# Patient Record
Sex: Female | Born: 1945 | Race: White | Hispanic: No | Marital: Married | Smoking: Former smoker
Health system: Southern US, Community
[De-identification: ages and names within clinical notes are randomized; demographics above are authoritative.]

## PROBLEM LIST (undated history)

## (undated) DIAGNOSIS — G3184 Mild cognitive impairment, so stated: Secondary | ICD-10-CM

## (undated) DIAGNOSIS — E785 Hyperlipidemia, unspecified: Secondary | ICD-10-CM

## (undated) DIAGNOSIS — C439 Malignant melanoma of skin, unspecified: Secondary | ICD-10-CM

## (undated) DIAGNOSIS — I639 Cerebral infarction, unspecified: Secondary | ICD-10-CM

## (undated) DIAGNOSIS — I69328 Other speech and language deficits following cerebral infarction: Secondary | ICD-10-CM

## (undated) DIAGNOSIS — C50919 Malignant neoplasm of unspecified site of unspecified female breast: Secondary | ICD-10-CM

## (undated) HISTORY — PX: BREAST LUMPECTOMY: SHX2

## (undated) HISTORY — PX: TONSILLECTOMY: SUR1361

## (undated) HISTORY — PX: ABDOMINAL HYSTERECTOMY: SHX81

---

## 1991-05-08 HISTORY — PX: BREAST EXCISIONAL BIOPSY: SUR124

## 2007-05-08 DIAGNOSIS — C50919 Malignant neoplasm of unspecified site of unspecified female breast: Secondary | ICD-10-CM

## 2007-05-08 HISTORY — DX: Malignant neoplasm of unspecified site of unspecified female breast: C50.919

## 2009-02-04 ENCOUNTER — Ambulatory Visit: Payer: Self-pay | Admitting: Internal Medicine

## 2009-02-16 ENCOUNTER — Ambulatory Visit: Payer: Self-pay | Admitting: Internal Medicine

## 2009-03-07 ENCOUNTER — Ambulatory Visit: Payer: Self-pay | Admitting: Internal Medicine

## 2009-08-05 ENCOUNTER — Ambulatory Visit: Payer: Self-pay | Admitting: Internal Medicine

## 2009-08-17 ENCOUNTER — Ambulatory Visit: Payer: Self-pay | Admitting: Internal Medicine

## 2009-09-04 ENCOUNTER — Ambulatory Visit: Payer: Self-pay | Admitting: Internal Medicine

## 2009-10-05 ENCOUNTER — Ambulatory Visit: Payer: Self-pay | Admitting: Internal Medicine

## 2010-02-04 ENCOUNTER — Ambulatory Visit: Payer: Self-pay | Admitting: Internal Medicine

## 2010-02-17 ENCOUNTER — Ambulatory Visit: Payer: Self-pay | Admitting: Internal Medicine

## 2010-02-18 LAB — CANCER ANTIGEN 27.29: CA 27.29: 17.3 U/mL (ref 0.0–38.6)

## 2010-03-07 ENCOUNTER — Ambulatory Visit: Payer: Self-pay | Admitting: Internal Medicine

## 2010-03-13 ENCOUNTER — Ambulatory Visit: Payer: Self-pay | Admitting: Gastroenterology

## 2010-08-18 ENCOUNTER — Ambulatory Visit: Payer: Self-pay | Admitting: Internal Medicine

## 2010-08-20 LAB — CANCER ANTIGEN 27.29: CA 27.29: 18.8 U/mL (ref 0.0–38.6)

## 2010-09-05 ENCOUNTER — Ambulatory Visit: Payer: Self-pay | Admitting: Internal Medicine

## 2010-10-06 ENCOUNTER — Ambulatory Visit: Payer: Self-pay | Admitting: Internal Medicine

## 2011-10-02 ENCOUNTER — Ambulatory Visit: Payer: Self-pay | Admitting: Oncology

## 2011-10-02 LAB — CBC CANCER CENTER
Basophil #: 0.1 x10 3/mm (ref 0.0–0.1)
Eosinophil #: 0.4 x10 3/mm (ref 0.0–0.7)
Eosinophil %: 8.2 %
HGB: 13.9 g/dL (ref 12.0–16.0)
Lymphocyte %: 28.9 %
MCH: 31 pg (ref 26.0–34.0)
MCHC: 32.8 g/dL (ref 32.0–36.0)
Monocyte #: 0.3 x10 3/mm (ref 0.2–0.9)
Monocyte %: 7.2 %
Neutrophil #: 2.5 x10 3/mm (ref 1.4–6.5)
Neutrophil %: 54.3 %
Platelet: 233 x10 3/mm (ref 150–440)
RBC: 4.49 10*6/uL (ref 3.80–5.20)
WBC: 4.6 x10 3/mm (ref 3.6–11.0)

## 2011-10-02 LAB — COMPREHENSIVE METABOLIC PANEL
Albumin: 4.2 g/dL (ref 3.4–5.0)
Anion Gap: 8 (ref 7–16)
BUN: 18 mg/dL (ref 7–18)
Bilirubin,Total: 0.5 mg/dL (ref 0.2–1.0)
Calcium, Total: 9.4 mg/dL (ref 8.5–10.1)
Chloride: 105 mmol/L (ref 98–107)
Creatinine: 1.23 mg/dL (ref 0.60–1.30)
Glucose: 143 mg/dL — ABNORMAL HIGH (ref 65–99)
Potassium: 4.1 mmol/L (ref 3.5–5.1)
SGOT(AST): 33 U/L (ref 15–37)
SGPT (ALT): 34 U/L
Total Protein: 7.8 g/dL (ref 6.4–8.2)

## 2011-10-03 LAB — CANCER ANTIGEN 27.29: CA 27.29: 24.2 U/mL (ref 0.0–38.6)

## 2011-10-06 ENCOUNTER — Ambulatory Visit: Payer: Self-pay | Admitting: Oncology

## 2011-11-05 ENCOUNTER — Ambulatory Visit: Payer: Self-pay | Admitting: Oncology

## 2012-04-07 ENCOUNTER — Ambulatory Visit: Payer: Self-pay | Admitting: Oncology

## 2012-04-07 LAB — COMPREHENSIVE METABOLIC PANEL
Albumin: 4.1 g/dL (ref 3.4–5.0)
Anion Gap: 11 (ref 7–16)
BUN: 16 mg/dL (ref 7–18)
Bilirubin,Total: 0.5 mg/dL (ref 0.2–1.0)
Calcium, Total: 9.7 mg/dL (ref 8.5–10.1)
Co2: 27 mmol/L (ref 21–32)
Glucose: 98 mg/dL (ref 65–99)
Osmolality: 282 (ref 275–301)
Potassium: 4 mmol/L (ref 3.5–5.1)
SGOT(AST): 34 U/L (ref 15–37)
Sodium: 141 mmol/L (ref 136–145)

## 2012-04-07 LAB — CBC CANCER CENTER
Basophil #: 0.1 x10 3/mm (ref 0.0–0.1)
Basophil %: 1.8 %
Eosinophil #: 0.4 x10 3/mm (ref 0.0–0.7)
Eosinophil %: 8.1 %
HCT: 40.3 % (ref 35.0–47.0)
HGB: 13.6 g/dL (ref 12.0–16.0)
Lymphocyte #: 1.4 x10 3/mm (ref 1.0–3.6)
Lymphocyte %: 30.8 %
MCHC: 33.7 g/dL (ref 32.0–36.0)
MCV: 92 fL (ref 80–100)
Monocyte %: 9.3 %
Neutrophil #: 2.3 x10 3/mm (ref 1.4–6.5)
Neutrophil %: 50 %
RBC: 4.38 10*6/uL (ref 3.80–5.20)
RDW: 13.9 % (ref 11.5–14.5)

## 2012-05-07 ENCOUNTER — Ambulatory Visit: Payer: Self-pay | Admitting: Oncology

## 2012-10-06 ENCOUNTER — Ambulatory Visit: Payer: Self-pay | Admitting: Oncology

## 2012-10-06 LAB — COMPREHENSIVE METABOLIC PANEL
Albumin: 4.1 g/dL (ref 3.4–5.0)
Alkaline Phosphatase: 45 U/L — ABNORMAL LOW (ref 50–136)
Anion Gap: 10 (ref 7–16)
BUN: 13 mg/dL (ref 7–18)
Bilirubin,Total: 0.5 mg/dL (ref 0.2–1.0)
Chloride: 102 mmol/L (ref 98–107)
Co2: 27 mmol/L (ref 21–32)
Creatinine: 1.25 mg/dL (ref 0.60–1.30)
EGFR (African American): 52 — ABNORMAL LOW
EGFR (Non-African Amer.): 45 — ABNORMAL LOW
Osmolality: 278 (ref 275–301)
Potassium: 4.2 mmol/L (ref 3.5–5.1)
SGOT(AST): 32 U/L (ref 15–37)

## 2012-10-06 LAB — CBC CANCER CENTER
Basophil %: 1.3 %
HCT: 41.5 % (ref 35.0–47.0)
Lymphocyte #: 1.5 x10 3/mm (ref 1.0–3.6)
MCH: 30.9 pg (ref 26.0–34.0)
MCHC: 33.9 g/dL (ref 32.0–36.0)
MCV: 91 fL (ref 80–100)
Monocyte #: 0.4 x10 3/mm (ref 0.2–0.9)
Neutrophil %: 51.8 %
Platelet: 219 x10 3/mm (ref 150–440)
RBC: 4.56 10*6/uL (ref 3.80–5.20)
RDW: 13.7 % (ref 11.5–14.5)
WBC: 4.5 x10 3/mm (ref 3.6–11.0)

## 2012-10-07 LAB — CANCER ANTIGEN 27.29: CA 27.29: 23 U/mL (ref 0.0–38.6)

## 2012-11-04 ENCOUNTER — Ambulatory Visit: Payer: Self-pay | Admitting: Oncology

## 2012-12-05 ENCOUNTER — Ambulatory Visit: Payer: Self-pay | Admitting: Oncology

## 2013-04-06 ENCOUNTER — Ambulatory Visit: Payer: Self-pay | Admitting: Oncology

## 2013-04-06 LAB — COMPREHENSIVE METABOLIC PANEL
Albumin: 4.2 g/dL (ref 3.4–5.0)
Alkaline Phosphatase: 50 U/L
Anion Gap: 6 — ABNORMAL LOW (ref 7–16)
Bilirubin,Total: 0.4 mg/dL (ref 0.2–1.0)
Co2: 31 mmol/L (ref 21–32)
EGFR (African American): 56 — ABNORMAL LOW
EGFR (Non-African Amer.): 48 — ABNORMAL LOW
Glucose: 100 mg/dL — ABNORMAL HIGH (ref 65–99)
Potassium: 4.5 mmol/L (ref 3.5–5.1)
SGOT(AST): 25 U/L (ref 15–37)
SGPT (ALT): 34 U/L (ref 12–78)

## 2013-04-06 LAB — CBC CANCER CENTER
Eosinophil %: 6.4 %
HCT: 42.9 % (ref 35.0–47.0)
HGB: 14.3 g/dL (ref 12.0–16.0)
MCH: 30.6 pg (ref 26.0–34.0)
MCV: 92 fL (ref 80–100)
Monocyte #: 0.5 x10 3/mm (ref 0.2–0.9)
Monocyte %: 9.2 %
Platelet: 238 x10 3/mm (ref 150–440)
RBC: 4.65 10*6/uL (ref 3.80–5.20)
WBC: 5.1 x10 3/mm (ref 3.6–11.0)

## 2013-04-07 LAB — CANCER ANTIGEN 27.29: CA 27.29: 16.4 U/mL (ref 0.0–38.6)

## 2013-05-07 ENCOUNTER — Ambulatory Visit: Payer: Self-pay | Admitting: Oncology

## 2013-06-07 ENCOUNTER — Ambulatory Visit: Payer: Self-pay | Admitting: Oncology

## 2013-09-21 ENCOUNTER — Ambulatory Visit: Payer: Self-pay | Admitting: Neurology

## 2013-11-09 ENCOUNTER — Ambulatory Visit: Payer: Self-pay | Admitting: Oncology

## 2013-11-09 LAB — CBC CANCER CENTER
Basophil #: 0.1 x10 3/mm (ref 0.0–0.1)
Basophil %: 2.1 %
EOS PCT: 6.1 %
Eosinophil #: 0.3 x10 3/mm (ref 0.0–0.7)
HCT: 41.7 % (ref 35.0–47.0)
HGB: 13.8 g/dL (ref 12.0–16.0)
LYMPHS ABS: 1.5 x10 3/mm (ref 1.0–3.6)
LYMPHS PCT: 34.9 %
MCH: 30.5 pg (ref 26.0–34.0)
MCHC: 33 g/dL (ref 32.0–36.0)
MCV: 92 fL (ref 80–100)
MONO ABS: 0.4 x10 3/mm (ref 0.2–0.9)
MONOS PCT: 8.3 %
NEUTROS ABS: 2.1 x10 3/mm (ref 1.4–6.5)
NEUTROS PCT: 48.6 %
PLATELETS: 222 x10 3/mm (ref 150–440)
RBC: 4.53 10*6/uL (ref 3.80–5.20)
RDW: 13.3 % (ref 11.5–14.5)
WBC: 4.3 x10 3/mm (ref 3.6–11.0)

## 2013-11-09 LAB — COMPREHENSIVE METABOLIC PANEL
ALK PHOS: 47 U/L
AST: 21 U/L (ref 15–37)
Albumin: 4 g/dL (ref 3.4–5.0)
Anion Gap: 5 — ABNORMAL LOW (ref 7–16)
BUN: 14 mg/dL (ref 7–18)
Bilirubin,Total: 0.4 mg/dL (ref 0.2–1.0)
CALCIUM: 10 mg/dL (ref 8.5–10.1)
CHLORIDE: 105 mmol/L (ref 98–107)
Co2: 31 mmol/L (ref 21–32)
Creatinine: 1.17 mg/dL (ref 0.60–1.30)
EGFR (African American): 56 — ABNORMAL LOW
EGFR (Non-African Amer.): 48 — ABNORMAL LOW
Glucose: 95 mg/dL (ref 65–99)
Osmolality: 282 (ref 275–301)
Potassium: 4.7 mmol/L (ref 3.5–5.1)
SGPT (ALT): 27 U/L (ref 12–78)
Sodium: 141 mmol/L (ref 136–145)
TOTAL PROTEIN: 7.8 g/dL (ref 6.4–8.2)

## 2013-12-05 ENCOUNTER — Ambulatory Visit: Payer: Self-pay | Admitting: Oncology

## 2014-01-05 ENCOUNTER — Ambulatory Visit: Payer: Self-pay | Admitting: Oncology

## 2014-05-19 ENCOUNTER — Ambulatory Visit: Payer: Self-pay | Admitting: Oncology

## 2014-05-19 LAB — CBC CANCER CENTER
BASOS PCT: 2 %
Basophil #: 0.1 x10 3/mm (ref 0.0–0.1)
EOS ABS: 0.3 x10 3/mm (ref 0.0–0.7)
Eosinophil %: 6.1 %
HCT: 39 % (ref 35.0–47.0)
HGB: 12.8 g/dL (ref 12.0–16.0)
LYMPHS ABS: 1.4 x10 3/mm (ref 1.0–3.6)
Lymphocyte %: 33.1 %
MCH: 29.1 pg (ref 26.0–34.0)
MCHC: 32.8 g/dL (ref 32.0–36.0)
MCV: 89 fL (ref 80–100)
MONO ABS: 0.4 x10 3/mm (ref 0.2–0.9)
Monocyte %: 8.4 %
NEUTROS PCT: 50.4 %
Neutrophil #: 2.1 x10 3/mm (ref 1.4–6.5)
Platelet: 259 x10 3/mm (ref 150–440)
RBC: 4.41 10*6/uL (ref 3.80–5.20)
RDW: 13.7 % (ref 11.5–14.5)
WBC: 4.2 x10 3/mm (ref 3.6–11.0)

## 2014-05-19 LAB — COMPREHENSIVE METABOLIC PANEL
ANION GAP: 4 — AB (ref 7–16)
Albumin: 3.7 g/dL (ref 3.4–5.0)
Alkaline Phosphatase: 47 U/L
BUN: 18 mg/dL (ref 7–18)
Bilirubin,Total: 0.3 mg/dL (ref 0.2–1.0)
Calcium, Total: 9.2 mg/dL (ref 8.5–10.1)
Chloride: 106 mmol/L (ref 98–107)
Co2: 31 mmol/L (ref 21–32)
Creatinine: 1.21 mg/dL (ref 0.60–1.30)
EGFR (African American): 57 — ABNORMAL LOW
EGFR (Non-African Amer.): 47 — ABNORMAL LOW
GLUCOSE: 96 mg/dL (ref 65–99)
Osmolality: 283 (ref 275–301)
POTASSIUM: 4.2 mmol/L (ref 3.5–5.1)
SGOT(AST): 19 U/L (ref 15–37)
SGPT (ALT): 26 U/L
Sodium: 141 mmol/L (ref 136–145)
TOTAL PROTEIN: 7.3 g/dL (ref 6.4–8.2)

## 2014-06-07 ENCOUNTER — Ambulatory Visit: Payer: Self-pay | Admitting: Oncology

## 2014-08-20 ENCOUNTER — Other Ambulatory Visit: Payer: Self-pay | Admitting: Oncology

## 2014-08-20 DIAGNOSIS — Z853 Personal history of malignant neoplasm of breast: Secondary | ICD-10-CM

## 2014-11-26 ENCOUNTER — Other Ambulatory Visit: Payer: Self-pay

## 2014-11-26 ENCOUNTER — Emergency Department: Payer: Commercial Managed Care - HMO

## 2014-11-26 ENCOUNTER — Emergency Department
Admission: EM | Admit: 2014-11-26 | Discharge: 2014-11-26 | Disposition: A | Discharge status: Home / Self Care | Payer: Commercial Managed Care - HMO | Attending: Emergency Medicine | Admitting: Emergency Medicine

## 2014-11-26 DIAGNOSIS — R200 Anesthesia of skin: Secondary | ICD-10-CM | POA: Diagnosis not present

## 2014-11-26 DIAGNOSIS — Z87891 Personal history of nicotine dependence: Secondary | ICD-10-CM | POA: Diagnosis not present

## 2014-11-26 DIAGNOSIS — R0789 Other chest pain: Secondary | ICD-10-CM | POA: Diagnosis present

## 2014-11-26 DIAGNOSIS — R202 Paresthesia of skin: Secondary | ICD-10-CM | POA: Diagnosis not present

## 2014-11-26 DIAGNOSIS — R42 Dizziness and giddiness: Secondary | ICD-10-CM | POA: Diagnosis not present

## 2014-11-26 HISTORY — DX: Hyperlipidemia, unspecified: E78.5

## 2014-11-26 HISTORY — DX: Cerebral infarction, unspecified: I63.9

## 2014-11-26 HISTORY — DX: Mild cognitive impairment of uncertain or unknown etiology: G31.84

## 2014-11-26 HISTORY — DX: Other speech and language deficits following cerebral infarction: I69.328

## 2014-11-26 LAB — TROPONIN I
Troponin I: <0.03 ng/mL (ref <0.031)
Troponin I: <0.03 ng/mL (ref <0.031)

## 2014-11-26 LAB — BASIC METABOLIC PANEL
Anion gap: 9 (ref 5–15)
BUN: 23 mg/dL — ABNORMAL HIGH (ref 6–20)
CALCIUM: 9.9 mg/dL (ref 8.9–10.3)
CO2: 28 mmol/L (ref 22–32)
Chloride: 103 mmol/L (ref 101–111)
Creatinine, Ser: 1.21 mg/dL — ABNORMAL HIGH (ref 0.44–1.00)
GFR calc non Af Amer: 45 mL/min — ABNORMAL LOW (ref >60)
GFR, EST AFRICAN AMERICAN: 52 mL/min — AB (ref >60)
GLUCOSE: 112 mg/dL — AB (ref 65–99)
Potassium: 3.8 mmol/L (ref 3.5–5.1)
Sodium: 140 mmol/L (ref 135–145)

## 2014-11-26 LAB — CBC
HCT: 39.2 % (ref 35.0–47.0)
Hemoglobin: 13 g/dL (ref 12.0–16.0)
MCH: 29.6 pg (ref 26.0–34.0)
MCHC: 33.1 g/dL (ref 32.0–36.0)
MCV: 89.4 fL (ref 80.0–100.0)
Platelets: 208 10*3/uL (ref 150–440)
RBC: 4.38 MIL/uL (ref 3.80–5.20)
RDW: 14.2 % (ref 11.5–14.5)
WBC: 7 10*3/uL (ref 3.6–11.0)

## 2014-11-26 NOTE — ED Notes (Signed)
Pt c/o tightness in chest with dizziness that started around 1045am today.

## 2014-11-26 NOTE — ED Notes (Signed)
Patient transported to CT 

## 2014-11-26 NOTE — ED Provider Notes (Signed)
[facility] Emergency Department Provider Note   ____________________________________________  Time seen: 1420  I have reviewed the triage vital signs and the nursing notes.   HISTORY  Chief Complaint Chest Pain and Dizziness   History limited by: Not Limited   HPI Teresa Turner is a 69 y.o. female who presents to the emergency department today accompanied by her husband because of concerns for an episode of paresthesias as well as chest pressure. The patient was at home earlier this morning when she developed numbness and tingling in her extremities. She states it wasn't her arms and legs the distal aspects. She states that the same time she started developing some chest pressure that was located centrally. The symptoms lasted for a couple of hours. The symptoms of chest pressure and paresthesias have now resolved. She denies any recent fevers.   Past Medical History  Diagnosis Date  . Hyperlipemia   . CVA (cerebral infarction)   . 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 Cancer     breast  . MCI (mild cognitive impairment)     There are no active problems to display for this patient.   Past Surgical History  Procedure Laterality Date  . Tonsillectomy    . Breast lumpectomy    . Abdominal hysterectomy      No current outpatient prescriptions on file.  Allergies Mango flavor  No family history on file.  Social History History  Substance Use Topics  . Smoking status: Former Research scientist (life sciences)  . Smokeless tobacco: Not on file  . Alcohol Use: No    Review of Systems  Constitutional: Negative for fever. Cardiovascular: Also for chest pressure Respiratory: Negative for shortness of breath. Gastrointestinal: Negative for abdominal pain, vomiting and diarrhea. Genitourinary: Negative for dysuria. Musculoskeletal: Negative for back pain. Skin: Negative for rash. Neurological: Numbness and tingling of the extremities  10-point  ROS otherwise negative.  ____________________________________________   PHYSICAL EXAM:  VITAL SIGNS: ED Triage Vitals  Enc Vitals Group     BP 11/26/14 1237 134/83 mmHg     Pulse Rate 11/26/14 1237 67     Resp 11/26/14 1237 17     Temp 11/26/14 1237 97.9 F (36.6 C)     Temp Source 11/26/14 1237 Oral     SpO2 11/26/14 1237 96 %     Weight 11/26/14 1232 165 lb (74.844 kg)     Height 11/26/14 1232 5\' 9"  (1.753 m)     Head Cir --      Peak Flow --      Pain Score 11/26/14 1232 5   Constitutional: Alert and oriented. Well appearing and in no distress. Eyes: Conjunctivae are normal. PERRL. Normal extraocular movements. ENT   Head: Normocephalic and atraumatic.   Nose: No congestion/rhinnorhea.   Mouth/Throat: Mucous membranes are moist.   Neck: No stridor. Hematological/Lymphatic/Immunilogical: No cervical lymphadenopathy. Cardiovascular: Normal rate, regular rhythm.  No murmurs, rubs, or gallops. Respiratory: Normal respiratory effort without tachypnea nor retractions. Breath sounds are clear and equal bilaterally. No wheezes/rales/rhonchi. Gastrointestinal: Soft and nontender. No distention. There is no CVA tenderness. Genitourinary: Deferred Musculoskeletal: Normal range of motion in all extremities. No joint effusions.  No lower extremity tenderness nor edema. Neurologic:  Some word finding difficulty, which is chronic for the patient. No gross focal neurologic deficits are appreciated. Speech is normal.  Skin:  Skin is warm, dry and intact. No rash noted. Psychiatric: Mood and affect are normal. Speech and behavior are  normal. Patient exhibits appropriate insight and judgment.  ____________________________________________    LABS (pertinent positives/negatives)  Labs Reviewed  BASIC METABOLIC PANEL - Abnormal; Notable for the following:    Glucose, Bld 112 (*)    BUN 23 (*)    Creatinine, Ser 1.21 (*)    GFR calc non Af Amer 45 (*)    GFR calc Af Amer 52  (*)    All other components within normal limits  CBC  TROPONIN I  TROPONIN I   2nd trop pending at time of signout  ____________________________________________   EKG  I, Nance Pear, attending physician, personally viewed and interpreted this EKG  EKG Time: 1233 Rate: 68 Rhythm: NSR Axis: normal Intervals: qtc 472 QRS: narrow ST changes: No st elevation    ____________________________________________    RADIOLOGY  CXR:  IMPRESSION: No acute disease.  CT head: IMPRESSION: No acute intracranial abnormality.  ____________________________________________   PROCEDURES  Procedure(s) performed: None  Critical Care performed: No  ____________________________________________   INITIAL IMPRESSION / ASSESSMENT AND PLAN / ED COURSE  Pertinent labs & imaging results that were available during my care of the patient were reviewed by me and considered in my medical decision making (see chart for details).  Patient presents to the emergency department today after episode of numbness and tingling as well as chest pressure. The symptoms have now resolved. No concerning findings on physical exam. Head CT and chest x-ray without concerning findings, EKG without concerning findings. Given episode of chest pressure will check second Trop. If negative feel patient is safe for discharge home.  ____________________________________________   FINAL CLINICAL IMPRESSION(S) / ED DIAGNOSES  Chest pressure Parasthesias  Nance Pear, MD 11/26/14 [phone]

## 2014-11-26 NOTE — ED Provider Notes (Signed)
-----------------------------------------   3:30 PM on 11/26/2014 -----------------------------------------   Blood pressure 134/85, pulse 73, temperature 97.9 F (36.6 C), temperature source Oral, resp. rate 23, height 5\' 9"  (1.753 m), weight 165 lb (74.844 kg), SpO2 99 %.  Assuming care from Dr. Archie Balboa.  In short, Teresa Turner is a 69 y.o. female with a chief complaint of Chest Pain and Dizziness .  Refer to the original H&P for additional details.  The current plan of care is to follow-up second troponin and reassess.  ----------------------------------------- 4:53 PM on 11/26/2014 -----------------------------------------  The patient is very well-appearing, laughing and joking with me, and has had no repeat symptoms since she has been in the emergency department.  Her second troponin was negative.  I had an extensive discussion with the patient and her husband about her reassuring workup but the need for close outpatient follow-up.  Rather than following up directly with a new cardiologist, the patient prefers to follow-up with Dr. Ouida Sills, her primary care doctor, and I feel that that is appropriate.  I discussed with them the small but theoretical possibility that this could be either a TIA, or a "cardiac event ", but they state strongly feel that outpatient follow-up is appropriate and I agree.  I gave my usual and customary return precautions.   Teresa Kehr, MD 11/26/14 1655

## 2014-11-26 NOTE — ED Notes (Signed)
Pt discharged home after verbalizing understanding of discharge instructions; nad noted. 

## 2014-11-26 NOTE — Discharge Instructions (Signed)
You have been seen in the Emergency Department (ED) today for chest pressure and numbness/tingling.  As we have discussed todays test results are normal, but you may require further testing.  Please follow up with the recommended doctor as instructed above in these documents regarding todays emergent visit and your recent symptoms to discuss further management.  Continue to take your regular medications. If you are not doing so already, please also take a daily baby aspirin (81 mg), at least until you follow up with your doctor.  Return to the Emergency Department (ED) if you experience any further chest pain/pressure/tightness, difficulty breathing, or sudden sweating, or other symptoms that concern you.   Chest Pain (Nonspecific) It is often hard to give a specific diagnosis for the cause of chest pain. There is always a chance that your pain could be related to something serious, such as a heart attack or a blood clot in the lungs. You need to follow up with your health care provider for further evaluation. CAUSES   Heartburn.  Pneumonia or bronchitis.  Anxiety or stress.  Inflammation around your heart (pericarditis) or lung (pleuritis or pleurisy).  A blood clot in the lung.  A collapsed lung (pneumothorax). It can develop suddenly on its own (spontaneous pneumothorax) or from trauma to the chest.  Shingles infection (herpes zoster virus). The chest wall is composed of bones, muscles, and cartilage. Any of these can be the source of the pain.  The bones can be bruised by injury.  The muscles or cartilage can be strained by coughing or overwork.  The cartilage can be affected by inflammation and become sore (costochondritis). DIAGNOSIS  Lab tests or other studies may be needed to find the cause of your pain. Your health care provider may have you take a test called an ambulatory electrocardiogram (ECG). An ECG records your heartbeat patterns over a 24-hour period. You may also  have other tests, such as:  Transthoracic echocardiogram (TTE). During echocardiography, sound waves are used to evaluate how blood flows through your heart.  Transesophageal echocardiogram (TEE).  Cardiac monitoring. This allows your health care provider to monitor your heart rate and rhythm in real time.  Holter monitor. This is a portable device that records your heartbeat and can help diagnose heart arrhythmias. It allows your health care provider to track your heart activity for several days, if needed.  Stress tests by exercise or by giving medicine that makes the heart beat faster. TREATMENT   Treatment depends on what may be causing your chest pain. Treatment may include:  Acid blockers for heartburn.  Anti-inflammatory medicine.  Pain medicine for inflammatory conditions.  Antibiotics if an infection is present.  You may be advised to change lifestyle habits. This includes stopping smoking and avoiding alcohol, caffeine, and chocolate.  You may be advised to keep your head raised (elevated) when sleeping. This reduces the chance of acid going backward from your stomach into your esophagus. Most of the time, nonspecific chest pain will improve within 2-3 days with rest and mild pain medicine.  HOME CARE INSTRUCTIONS   If antibiotics were prescribed, take them as directed. Finish them even if you start to feel better.  For the next few days, avoid physical activities that bring on chest pain. Continue physical activities as directed.  Do not use any tobacco products, including cigarettes, chewing tobacco, or electronic cigarettes.  Avoid drinking alcohol.  Only take medicine as directed by your health care provider.  Follow your health care provider's  suggestions for further testing if your chest pain does not go away.  Keep any follow-up appointments you made. If you do not go to an appointment, you could develop lasting (chronic) problems with pain. If there is any  problem keeping an appointment, call to reschedule. SEEK MEDICAL CARE IF:   Your chest pain does not go away, even after treatment.  You have a rash with blisters on your chest.  You have a fever. SEEK IMMEDIATE MEDICAL CARE IF:   You have increased chest pain or pain that spreads to your arm, neck, jaw, back, or abdomen.  You have shortness of breath.  You have an increasing cough, or you cough up blood.  You have severe back or abdominal pain.  You feel nauseous or vomit.  You have severe weakness.  You faint.  You have chills. This is an emergency. Do not wait to see if the pain will go away. Get medical help at once. Call your local emergency services (911 in [place] Do not drive yourself to the hospital. MAKE SURE YOU:   Understand these instructions.  Will watch your condition.  Will get help right away if you are not doing well or get worse. Document Released: 01/31/2005 Document Revised: 04/28/2013 Document Reviewed: 11/27/2007 [facility] Patient Information 2015 [place], [place]. This information is not intended to replace advice given to you by your health care provider. Make sure you discuss any questions you have with your health care provider.

## 2014-11-26 NOTE — ED Notes (Signed)
Pt from home with husband. Pt has difficulty speaking (word finding) due to MCI (mild cognitive impairment) so husband is used to helping her speak. He stated that he returned home around 1130 and found the pt distressed because she had become dizzy, lightheaded, and had chest pain while he was gone. She states that she had tingling in her extremities and that she had tightness in her central chest, as well as some SOB and sweating. Pt denies chest pain at this time. She reports that the tingling in her extremities comes and goes. Husband also states that she was taking ?donepril?, stopped, and started it again yesterday. She took it this morning not long before this episode. Pt is alert & oriented (at baseline) during assessment and somewhat tearful.

## 2014-12-09 ENCOUNTER — Ambulatory Visit
Admission: RE | Admit: 2014-12-09 | Discharge: 2014-12-09 | Disposition: A | Discharge status: Home / Self Care | Payer: Commercial Managed Care - HMO | Source: Ambulatory Visit | Attending: Oncology | Admitting: Oncology

## 2014-12-09 ENCOUNTER — Ambulatory Visit: Payer: Self-pay

## 2014-12-09 DIAGNOSIS — Z853 Personal history of malignant neoplasm of breast: Secondary | ICD-10-CM | POA: Diagnosis present

## 2014-12-09 DIAGNOSIS — Z803 Family history of malignant neoplasm of breast: Secondary | ICD-10-CM | POA: Insufficient documentation

## 2014-12-09 HISTORY — DX: Malignant melanoma of skin, unspecified: C43.9

## 2014-12-09 HISTORY — DX: Malignant neoplasm of unspecified site of unspecified female breast: C50.919

## 2015-01-27 ENCOUNTER — Emergency Department
Admission: EM | Admit: 2015-01-27 | Discharge: 2015-01-27 | Disposition: A | Discharge status: Home / Self Care | Payer: Commercial Managed Care - HMO | Attending: Emergency Medicine | Admitting: Emergency Medicine

## 2015-01-27 DIAGNOSIS — R42 Dizziness and giddiness: Secondary | ICD-10-CM | POA: Insufficient documentation

## 2015-01-27 DIAGNOSIS — Z7982 Long term (current) use of aspirin: Secondary | ICD-10-CM | POA: Insufficient documentation

## 2015-01-27 DIAGNOSIS — Z79899 Other long term (current) drug therapy: Secondary | ICD-10-CM | POA: Diagnosis not present

## 2015-01-27 DIAGNOSIS — R55 Syncope and collapse: Secondary | ICD-10-CM

## 2015-01-27 DIAGNOSIS — Z87891 Personal history of nicotine dependence: Secondary | ICD-10-CM | POA: Insufficient documentation

## 2015-01-27 DIAGNOSIS — R61 Generalized hyperhidrosis: Secondary | ICD-10-CM | POA: Insufficient documentation

## 2015-01-27 LAB — URINALYSIS COMPLETE WITH MICROSCOPIC (ARMC ONLY)
Bacteria, UA: NONE SEEN
Bilirubin Urine: NEGATIVE
GLUCOSE, UA: NEGATIVE mg/dL
Hgb urine dipstick: NEGATIVE
Ketones, ur: NEGATIVE mg/dL
Leukocytes, UA: NEGATIVE
Nitrite: NEGATIVE
PH: 7 (ref 5.0–8.0)
PROTEIN: NEGATIVE mg/dL
Specific Gravity, Urine: 1.015 (ref 1.005–1.030)

## 2015-01-27 LAB — COMPREHENSIVE METABOLIC PANEL
ALT: 24 U/L (ref 14–54)
AST: 30 U/L (ref 15–41)
Albumin: 4.4 g/dL (ref 3.5–5.0)
Alkaline Phosphatase: 31 U/L — ABNORMAL LOW (ref 38–126)
Anion gap: 7 (ref 5–15)
BUN: 18 mg/dL (ref 6–20)
CHLORIDE: 105 mmol/L (ref 101–111)
CO2: 28 mmol/L (ref 22–32)
Calcium: 9.8 mg/dL (ref 8.9–10.3)
Creatinine, Ser: 1.27 mg/dL — ABNORMAL HIGH (ref 0.44–1.00)
GFR calc Af Amer: 49 mL/min — ABNORMAL LOW (ref >60)
GFR calc non Af Amer: 42 mL/min — ABNORMAL LOW (ref >60)
Glucose, Bld: 105 mg/dL — ABNORMAL HIGH (ref 65–99)
Potassium: 4.1 mmol/L (ref 3.5–5.1)
SODIUM: 140 mmol/L (ref 135–145)
Total Bilirubin: 0.9 mg/dL (ref 0.3–1.2)
Total Protein: 7.6 g/dL (ref 6.5–8.1)

## 2015-01-27 LAB — CBC WITH DIFFERENTIAL/PLATELET
Basophils Absolute: 0.1 10*3/uL (ref 0–0.1)
Basophils Relative: 1 %
EOS PCT: 2 %
Eosinophils Absolute: 0.1 10*3/uL (ref 0–0.7)
HCT: 39.6 % (ref 35.0–47.0)
Hemoglobin: 13 g/dL (ref 12.0–16.0)
LYMPHS ABS: 0.6 10*3/uL — AB (ref 1.0–3.6)
Lymphocytes Relative: 9 %
MCH: 29.8 pg (ref 26.0–34.0)
MCHC: 32.9 g/dL (ref 32.0–36.0)
MCV: 90.7 fL (ref 80.0–100.0)
MONO ABS: 0.4 10*3/uL (ref 0.2–0.9)
Monocytes Relative: 6 %
Neutro Abs: 5 10*3/uL (ref 1.4–6.5)
Neutrophils Relative %: 82 %
PLATELETS: 183 10*3/uL (ref 150–440)
RBC: 4.36 MIL/uL (ref 3.80–5.20)
RDW: 14.4 % (ref 11.5–14.5)
WBC: 6.1 10*3/uL (ref 3.6–11.0)

## 2015-01-27 LAB — TROPONIN I
Troponin I: <0.03 ng/mL (ref <0.031)
Troponin I: <0.03 ng/mL (ref <0.031)

## 2015-01-27 MED ORDER — SODIUM CHLORIDE 0.9 % IV BOLUS (SEPSIS)
1000.0000 mL | Freq: Once | INTRAVENOUS | Status: AC
  Administered 2015-01-27: 1000 mL via INTRAVENOUS

## 2015-01-27 NOTE — ED Provider Notes (Signed)
[facility] Emergency Department Provider Note  Time seen: 8:42 AM  I have reviewed the triage vital signs and the nursing notes.   HISTORY  Chief Complaint No chief complaint on file.    HPI Teresa Turner is a 69 y.o. female with a past medical history of CVA, speech and language deficits due to CVA, hyperlipidemia, presents the emergency department after a near syncopal event. According to the patient and EMS, the patient states she was in the kitchen getting her coffee when she became acutely lightheaded, very weak and felt like she is going to pass out. The patient was helped down to the floor by her husband, patient notes she was extremely diaphoretic/sweaty at this time. Denies any chest pain at any time, denies any palpitations, abdominal pain, or nausea. Did not fully lose consciousness. Did not fall or hit her head. Patient states a similar event occurred 2 months ago, with a normal workup. She has seen her neurologist Dr. Manuella Ghazi after this event, but was not told of any findings. Currently the patient denies any symptoms, states she feels back to normal.     Past Medical History  Diagnosis Date  . Hyperlipemia   . CVA (cerebral infarction)   . 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 MCI (mild cognitive impairment)   . Melanoma   . Breast cancer 2009    right. Lumpectomy and rad tx    There are no active problems to display for this patient.   Past Surgical History  Procedure Laterality Date  . Tonsillectomy    . Breast lumpectomy    . Abdominal hysterectomy    . Breast excisional biopsy Left 1993    negative    Current Outpatient Rx  Name  Route  Sig  Dispense  Refill  . alendronate (FOSAMAX) 70 MG tablet   Oral   Take 70 mg by mouth once a week. Pt takes on Saturday.         Marland Kitchen aspirin 81 MG chewable tablet   Oral   Chew 81 mg by mouth daily.         . Calcium Citrate-Vitamin D (CALCIUM + D PO)   Oral   Take  1 tablet by mouth at bedtime.         . citalopram (CELEXA) 20 MG tablet   Oral   Take 20 mg by mouth daily.         Marland Kitchen donepezil (ARICEPT) 10 MG tablet   Oral   Take 10 mg by mouth daily.         . fenofibrate 160 MG tablet   Oral   Take 160 mg by mouth at bedtime.         Marland Kitchen levothyroxine (SYNTHROID, LEVOTHROID) 75 MCG tablet   Oral   Take 75 mcg by mouth daily.         . Omega-3 Fatty Acids (FISH OIL) 1000 MG CAPS   Oral   Take 1 capsule by mouth daily.         . vitamin B-12 (CYANOCOBALAMIN) 1000 MCG tablet   Oral   Take 1,000 mcg by mouth daily.           Allergies Mango flavor  Family History  Problem Relation Age of Onset  . Breast cancer Mother 25  . Breast cancer Maternal Aunt 60  . Breast cancer Maternal Grandmother 82    Social History Social History  Substance Use  Topics  . Smoking status: Former Research scientist (life sciences)  . Smokeless tobacco: Not on file  . Alcohol Use: No    Review of Systems Constitutional: Negative for fever. Cardiovascular: Negative for chest pain. Respiratory: Negative for shortness of breath. Gastrointestinal: Negative for abdominal pain. Denies nausea or vomiting. Neurological: Negative for headaches, focal weakness or numbness. 10-point ROS otherwise negative.  ____________________________________________   PHYSICAL EXAM:  Constitutional: Alert and oriented. Well appearing and in no distress. Eyes: Normal exam, 2 mm PERRL. ENT   Head: Normocephalic and atraumatic.   Mouth/Throat: Mucous membranes are moist. Cardiovascular: Normal rate, regular rhythm. No murmur Respiratory: Normal respiratory effort without tachypnea nor retractions. Breath sounds are clear and equal bilaterally. No wheezes/rales/rhonchi. Gastrointestinal: Soft and nontender. No distention.   Musculoskeletal: Nontender with normal range of motion in all extremities. No lower extremity tenderness or edema. Neurologic:  No gross deficits on exam.  Patient does have difficulty speaking at times, but per report this is due to her history of CVA. Skin:  Skin is warm, dry and intact.  Psychiatric: Mood and affect are normal. Speech and behavior are normal.  ____________________________________________    EKG  EKG reviewed and interpreted by myself shows normal sinus rhythm at 66 bpm, narrow QRS, normal axis, normal intervals, nonspecific ST changes are present. Largely unchanged from 11/26/14.  ____________________________________________    INITIAL IMPRESSION / ASSESSMENT AND PLAN / ED COURSE  Pertinent labs & imaging results that were available during my care of the patient were reviewed by me and considered in my medical decision making (see chart for details).  Patient presents after a near syncopal episode with diaphoresis. Patient appears well currently, we will check labs. EKG is unchanged from prior EKG. She hasn't had a similar event on 11/26/14, seen in the emergency department with a negative workup including 2 troponins and a normal CT scan. We'll monitor the patient very closely while awaiting lab work, and we will IV hydrate. Patient has no complaints currently.   Labs within normal limits. Patient's second troponin is negative as well. Patient states she feels very well, has eaten and drinking here. Patient wishing to go home. I discussed with the patient need to follow up with a cardiologist to consider a Holter monitor. The patient is agreeable to plan.  ____________________________________________   FINAL CLINICAL IMPRESSION(S) / ED DIAGNOSES  Near-syncope   Harvest Dark, MD 01/27/15 1306

## 2015-01-27 NOTE — ED Notes (Signed)
Patient brought in via EMS from home for near syncopal episode.  Per EMS husband reported that patient became diaphoretic while cooking breakfast and verbalized that she felt like she was going to pass out.  Husband assisted patient to floor and called 911.

## 2015-01-27 NOTE — Discharge Instructions (Signed)
Please follow-up with cardiology by calling the number provided, to discuss possible Holter monitor placement. Return to the emergency department for any further lightheadedness, near-syncope episodes, any chest pain, trouble breathing, or any other symptoms personally concerning to yourself.   Near-Syncope Near-syncope (commonly known as near fainting) is sudden weakness, dizziness, or feeling like you might pass out. During an episode of near-syncope, you may also develop pale skin, have tunnel vision, or feel sick to your stomach (nauseous). Near-syncope may occur when getting up after sitting or while standing for a long time. It is caused by a sudden decrease in blood flow to the brain. This decrease can result from various causes or triggers, most of which are not serious. However, because near-syncope can sometimes be a sign of something serious, a medical evaluation is required. The specific cause is often not determined. HOME CARE INSTRUCTIONS  Monitor your condition for any changes. The following actions may help to alleviate any discomfort you are experiencing:  Have someone stay with you until you feel stable.  Lie down right away and prop your feet up if you start feeling like you might faint. Breathe deeply and steadily. Wait until all the symptoms have passed. Most of these episodes last only a few minutes. You may feel tired for several hours.   Drink enough fluids to keep your urine clear or pale yellow.   If you are taking blood pressure or heart medicine, get up slowly when seated or lying down. Take several minutes to sit and then stand. This can reduce dizziness.  Follow up with your health care provider as directed. SEEK IMMEDIATE MEDICAL CARE IF:   You have a severe headache.   You have unusual pain in the chest, abdomen, or back.   You are bleeding from the mouth or rectum, or you have black or tarry stool.   You have an irregular or very fast heartbeat.    You have repeated fainting or have seizure-like jerking during an episode.   You faint when sitting or lying down.   You have confusion.   You have difficulty walking.   You have severe weakness.   You have vision problems.  MAKE SURE YOU:   Understand these instructions.  Will watch your condition.  Will get help right away if you are not doing well or get worse. Document Released: 04/23/2005 Document Revised: 04/28/2013 Document Reviewed: 09/26/2012 [facility] Patient Information 2015 [place], [place]. This information is not intended to replace advice given to you by your health care provider. Make sure you discuss any questions you have with your health care provider.

## 2015-05-17 ENCOUNTER — Other Ambulatory Visit: Payer: Self-pay | Admitting: *Deleted

## 2015-05-17 DIAGNOSIS — C50919 Malignant neoplasm of unspecified site of unspecified female breast: Secondary | ICD-10-CM

## 2015-05-25 ENCOUNTER — Inpatient Hospital Stay: Payer: Commercial Managed Care - HMO | Admitting: Oncology

## 2015-05-25 ENCOUNTER — Inpatient Hospital Stay: Payer: Commercial Managed Care - HMO

## 2015-06-07 ENCOUNTER — Inpatient Hospital Stay: Payer: Commercial Managed Care - HMO

## 2015-06-07 ENCOUNTER — Inpatient Hospital Stay: Payer: Commercial Managed Care - HMO | Admitting: Oncology

## 2015-06-27 ENCOUNTER — Other Ambulatory Visit: Payer: Commercial Managed Care - HMO

## 2015-06-27 ENCOUNTER — Ambulatory Visit: Payer: Commercial Managed Care - HMO | Admitting: Oncology

## 2017-04-06 IMAGING — CT CT HEAD W/O CM
1 series · 16 of 30 positions shown, 20 images · non-contrast
Comparison: Brain MRI 09/21/2013.

CLINICAL DATA: Numbness

EXAM:
CT HEAD WITHOUT CONTRAST
TECHNIQUE: Contiguous axial images were obtained from the base of the skull
through the vertex without intravenous contrast.

[Series 2: head wo · axial · 0.39mm/px · z∈[-26,+103]mm · 16 of 30 slices shown, 20 images]
[im 2/30  brain]
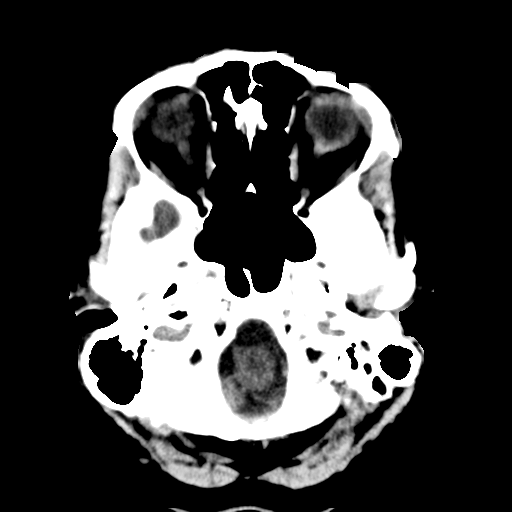
[im 2/30  bone]
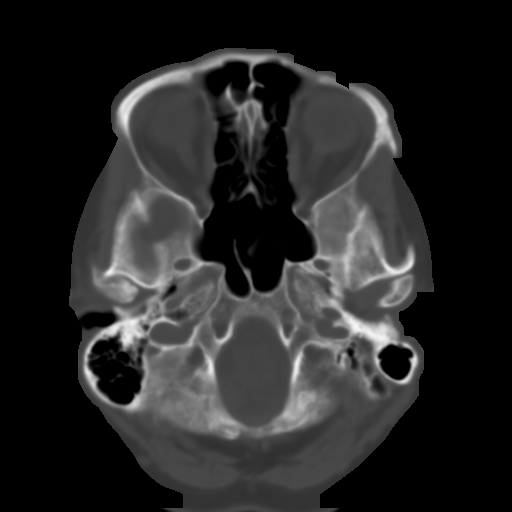
[im 4/30  brain]
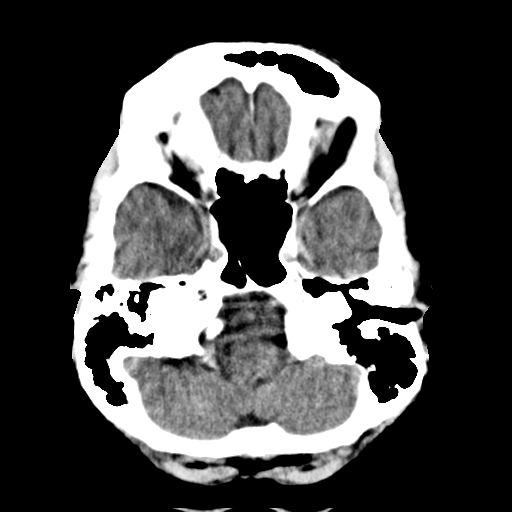
[im 6/30  brain]
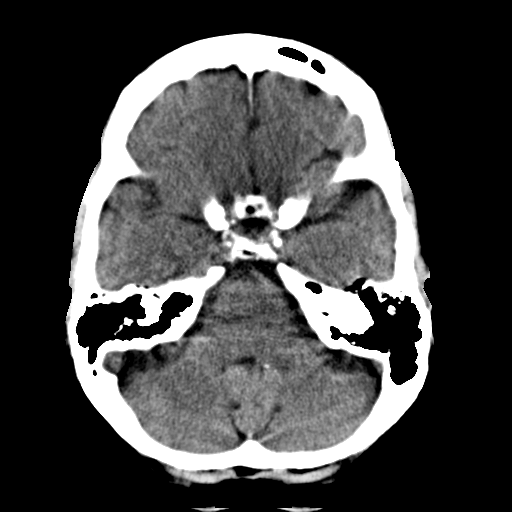
[im 8/30  brain]
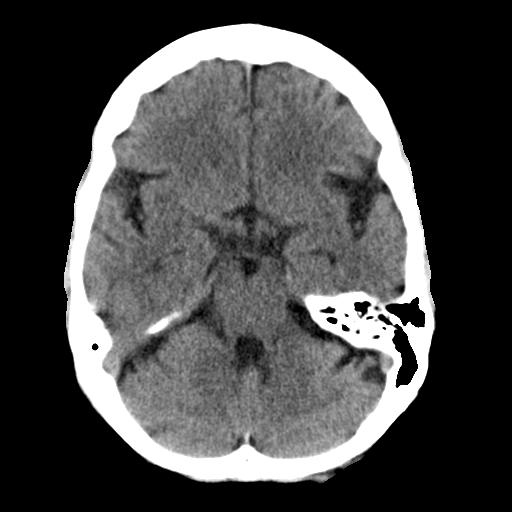
[im 9/30  brain]
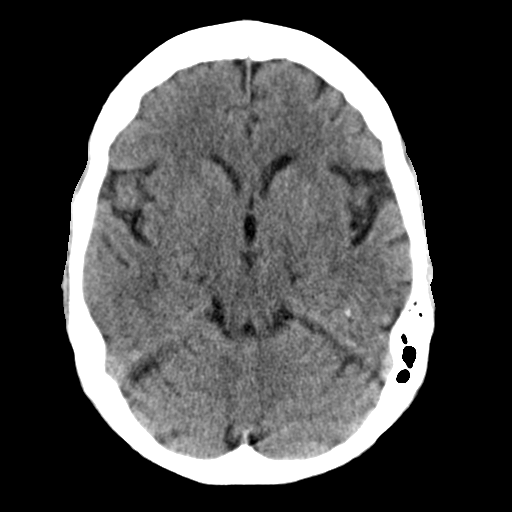
[im 9/30  bone]
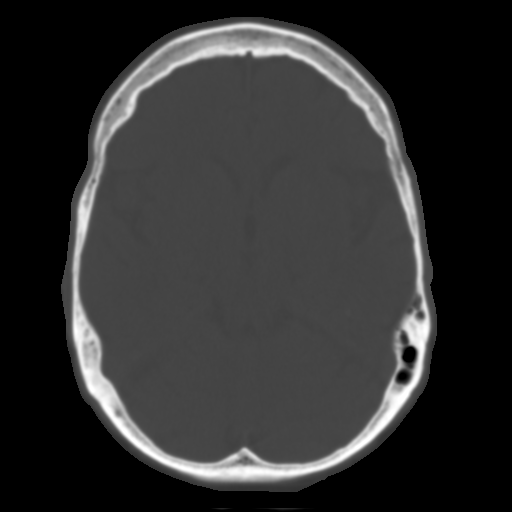
[im 11/30  brain]
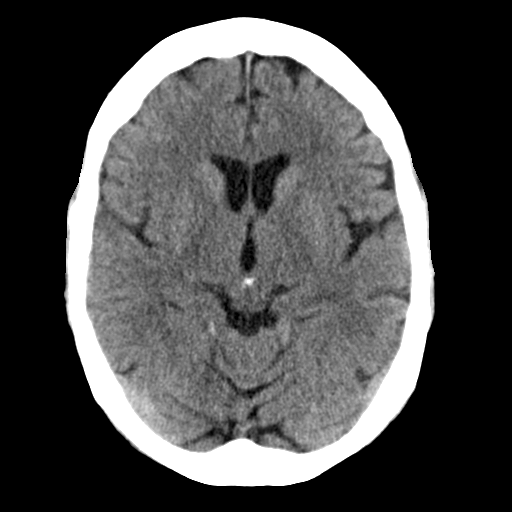
[im 13/30  brain]
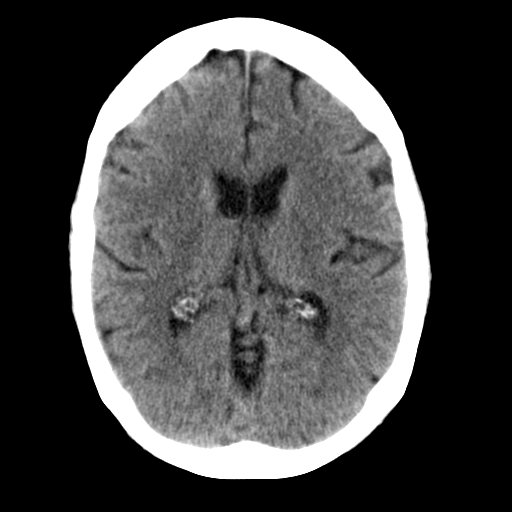
[im 15/30  brain]
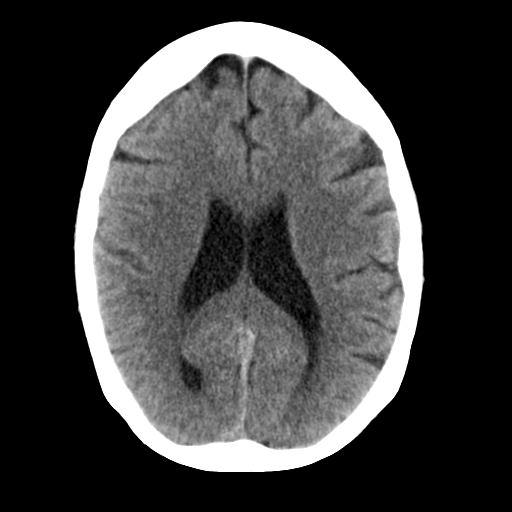
[im 16/30  brain]
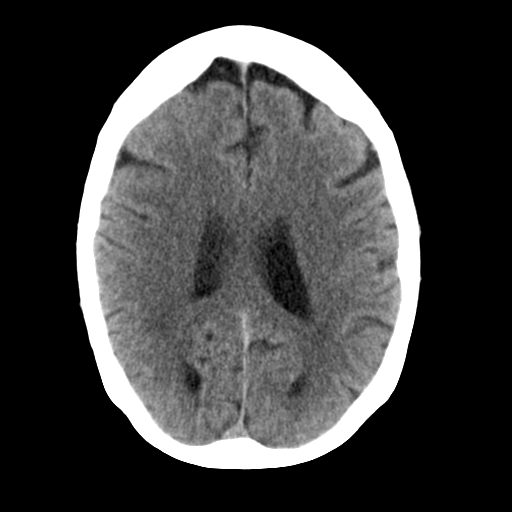
[im 16/30  bone]
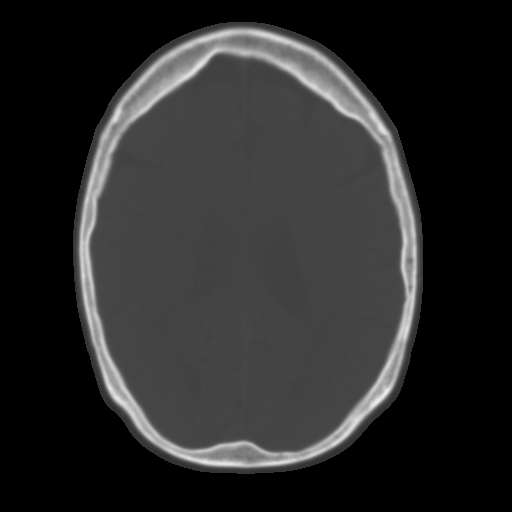
[im 18/30  brain]
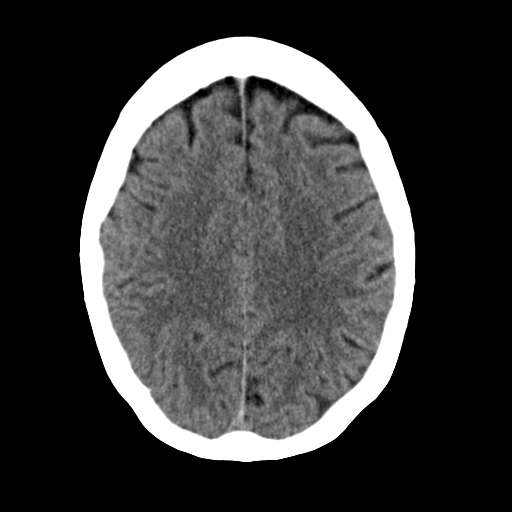
[im 20/30  brain]
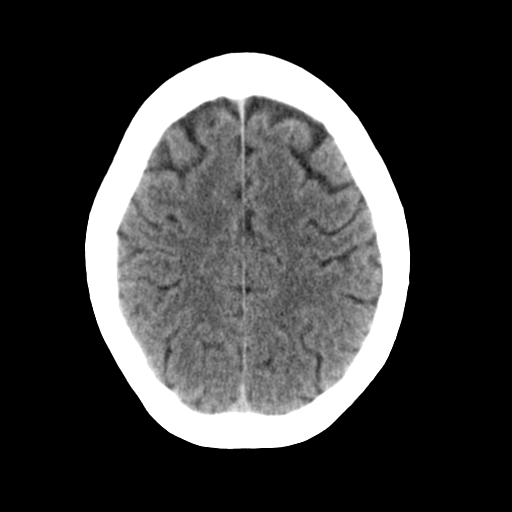
[im 22/30  brain]
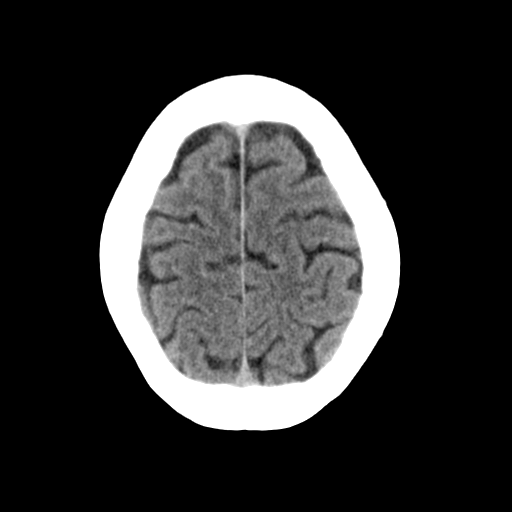
[im 23/30  brain]
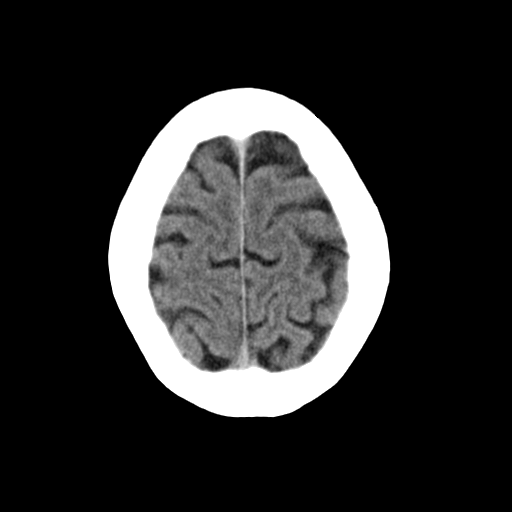
[im 23/30  bone]
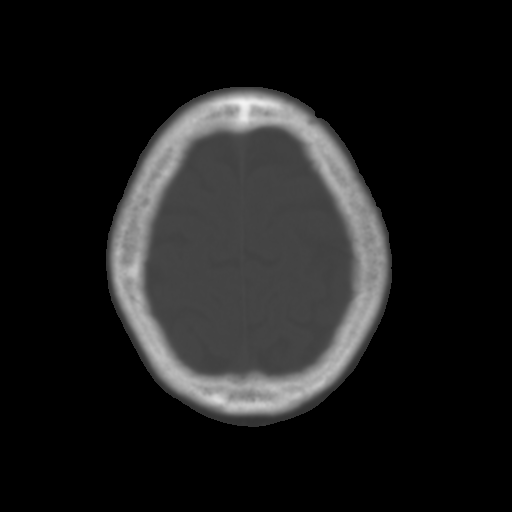
[im 25/30  brain]
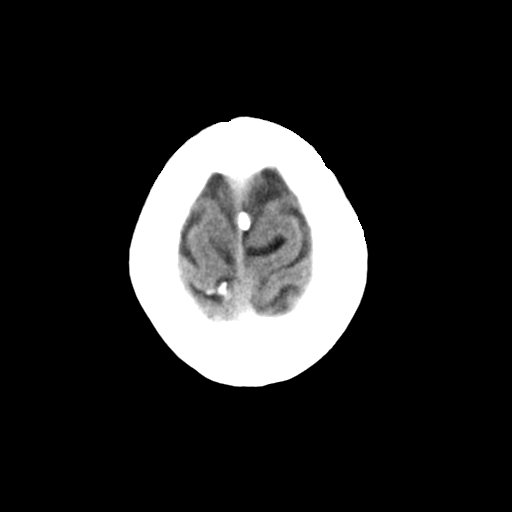
[im 27/30  brain]
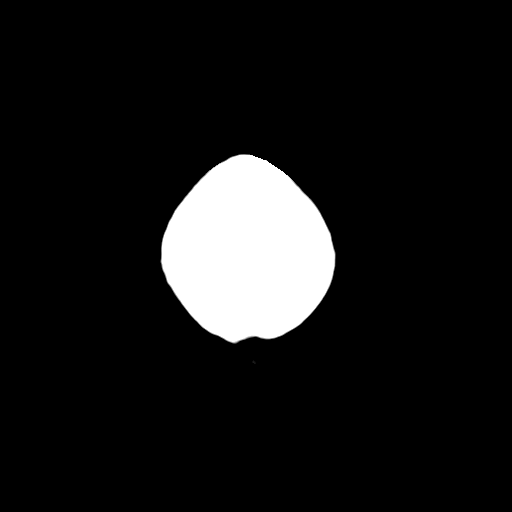
[im 29/30  brain]
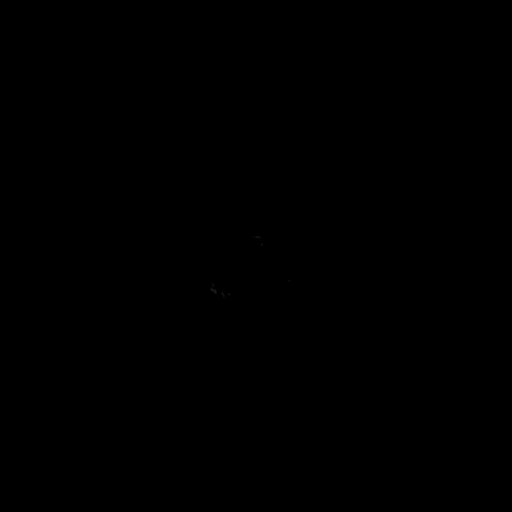

[16 of 30 positions shown; findings below may reference images not displayed]

FINDINGS: No skull fracture is noted. Paranasal sinuses and mastoid air cells
are unremarkable. No intracranial hemorrhage, mass effect or midline
shift. No acute cortical infarction. No mass lesion is noted on this
unenhanced scan. The gray and white-matter differentiation is
preserved.
IMPRESSION: No acute intracranial abnormality.
